# Patient Record
Sex: Male | Born: 1980
Health system: Southern US, Community
[De-identification: ages and names within clinical notes are randomized; demographics above are authoritative.]

## PROBLEM LIST (undated history)

## (undated) DIAGNOSIS — R0789 Other chest pain: Secondary | ICD-10-CM

## (undated) DIAGNOSIS — R079 Chest pain, unspecified: Secondary | ICD-10-CM

## (undated) DIAGNOSIS — E039 Hypothyroidism, unspecified: Secondary | ICD-10-CM

## (undated) DIAGNOSIS — J309 Allergic rhinitis, unspecified: Secondary | ICD-10-CM

## (undated) DIAGNOSIS — E079 Disorder of thyroid, unspecified: Secondary | ICD-10-CM

## (undated) DIAGNOSIS — E663 Overweight: Secondary | ICD-10-CM

## (undated) DIAGNOSIS — E785 Hyperlipidemia, unspecified: Secondary | ICD-10-CM

## (undated) DIAGNOSIS — K219 Gastro-esophageal reflux disease without esophagitis: Secondary | ICD-10-CM

## (undated) DIAGNOSIS — Z6828 Body mass index (BMI) 28.0-28.9, adult: Secondary | ICD-10-CM

## (undated) DIAGNOSIS — E559 Vitamin D deficiency, unspecified: Secondary | ICD-10-CM

## (undated) HISTORY — DX: Chest pain, unspecified: R07.9

## (undated) HISTORY — DX: Overweight: E66.3

## (undated) HISTORY — DX: Vitamin D deficiency, unspecified: E55.9

## (undated) HISTORY — DX: Body mass index (BMI) 28.0-28.9, adult: Z68.28

## (undated) HISTORY — DX: Hyperlipidemia, unspecified: E78.5

## (undated) HISTORY — DX: Allergic rhinitis, unspecified: J30.9

## (undated) HISTORY — DX: Other chest pain: R07.89

## (undated) HISTORY — DX: Gastro-esophageal reflux disease without esophagitis: K21.9

## (undated) HISTORY — DX: Hypothyroidism, unspecified: E03.9

---
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
## 2017-03-25 DIAGNOSIS — E559 Vitamin D deficiency, unspecified: Secondary | ICD-10-CM | POA: Diagnosis not present

## 2017-03-25 DIAGNOSIS — Z1331 Encounter for screening for depression: Secondary | ICD-10-CM | POA: Diagnosis not present

## 2017-03-25 DIAGNOSIS — E663 Overweight: Secondary | ICD-10-CM | POA: Diagnosis not present

## 2017-03-25 DIAGNOSIS — Z0000 Encounter for general adult medical examination without abnormal findings: Secondary | ICD-10-CM | POA: Diagnosis not present

## 2017-04-30 DIAGNOSIS — Z713 Dietary counseling and surveillance: Secondary | ICD-10-CM | POA: Diagnosis not present

## 2017-05-09 DIAGNOSIS — H9311 Tinnitus, right ear: Secondary | ICD-10-CM | POA: Diagnosis not present

## 2018-02-04 DIAGNOSIS — M542 Cervicalgia: Secondary | ICD-10-CM

## 2018-02-04 DIAGNOSIS — H9311 Tinnitus, right ear: Secondary | ICD-10-CM

## 2018-02-04 HISTORY — DX: Cervicalgia: M54.2

## 2018-02-04 HISTORY — DX: Tinnitus, right ear: H93.11

## 2018-06-16 DIAGNOSIS — Z0000 Encounter for general adult medical examination without abnormal findings: Secondary | ICD-10-CM | POA: Diagnosis not present

## 2018-06-16 DIAGNOSIS — Z1331 Encounter for screening for depression: Secondary | ICD-10-CM | POA: Diagnosis not present

## 2018-12-17 DIAGNOSIS — E559 Vitamin D deficiency, unspecified: Secondary | ICD-10-CM | POA: Diagnosis not present

## 2018-12-17 DIAGNOSIS — E039 Hypothyroidism, unspecified: Secondary | ICD-10-CM | POA: Diagnosis not present

## 2018-12-17 DIAGNOSIS — J309 Allergic rhinitis, unspecified: Secondary | ICD-10-CM | POA: Diagnosis not present

## 2018-12-17 DIAGNOSIS — E785 Hyperlipidemia, unspecified: Secondary | ICD-10-CM | POA: Diagnosis not present

## 2019-01-20 DIAGNOSIS — J328 Other chronic sinusitis: Secondary | ICD-10-CM | POA: Diagnosis not present

## 2019-01-20 DIAGNOSIS — J301 Allergic rhinitis due to pollen: Secondary | ICD-10-CM | POA: Diagnosis not present

## 2019-01-20 DIAGNOSIS — J342 Deviated nasal septum: Secondary | ICD-10-CM | POA: Diagnosis not present

## 2019-01-20 DIAGNOSIS — J3489 Other specified disorders of nose and nasal sinuses: Secondary | ICD-10-CM | POA: Diagnosis not present

## 2019-01-20 DIAGNOSIS — H9011 Conductive hearing loss, unilateral, right ear, with unrestricted hearing on the contralateral side: Secondary | ICD-10-CM | POA: Diagnosis not present

## 2019-01-28 DIAGNOSIS — H8010 Otosclerosis involving oval window, obliterative, unspecified ear: Secondary | ICD-10-CM | POA: Diagnosis not present

## 2019-01-28 DIAGNOSIS — H9201 Otalgia, right ear: Secondary | ICD-10-CM | POA: Diagnosis not present

## 2019-01-28 DIAGNOSIS — H9191 Unspecified hearing loss, right ear: Secondary | ICD-10-CM | POA: Diagnosis not present

## 2019-02-05 DIAGNOSIS — H8010 Otosclerosis involving oval window, obliterative, unspecified ear: Secondary | ICD-10-CM | POA: Diagnosis not present

## 2019-02-05 DIAGNOSIS — H90A11 Conductive hearing loss, unilateral, right ear with restricted hearing on the contralateral side: Secondary | ICD-10-CM | POA: Diagnosis not present

## 2019-02-05 DIAGNOSIS — H9011 Conductive hearing loss, unilateral, right ear, with unrestricted hearing on the contralateral side: Secondary | ICD-10-CM | POA: Diagnosis not present

## 2019-04-13 DIAGNOSIS — H8091 Unspecified otosclerosis, right ear: Secondary | ICD-10-CM | POA: Diagnosis not present

## 2019-04-13 DIAGNOSIS — J340 Abscess, furuncle and carbuncle of nose: Secondary | ICD-10-CM | POA: Diagnosis not present

## 2019-04-13 DIAGNOSIS — J342 Deviated nasal septum: Secondary | ICD-10-CM | POA: Diagnosis not present

## 2019-04-13 DIAGNOSIS — H9011 Conductive hearing loss, unilateral, right ear, with unrestricted hearing on the contralateral side: Secondary | ICD-10-CM | POA: Diagnosis not present

## 2019-05-18 DIAGNOSIS — Z6827 Body mass index (BMI) 27.0-27.9, adult: Secondary | ICD-10-CM | POA: Diagnosis not present

## 2019-05-18 DIAGNOSIS — H8101 Meniere's disease, right ear: Secondary | ICD-10-CM | POA: Diagnosis not present

## 2019-05-18 DIAGNOSIS — Z01118 Encounter for examination of ears and hearing with other abnormal findings: Secondary | ICD-10-CM | POA: Diagnosis not present

## 2019-05-18 DIAGNOSIS — H8090 Unspecified otosclerosis, unspecified ear: Secondary | ICD-10-CM | POA: Diagnosis not present

## 2019-05-18 DIAGNOSIS — H9011 Conductive hearing loss, unilateral, right ear, with unrestricted hearing on the contralateral side: Secondary | ICD-10-CM | POA: Diagnosis not present

## 2019-05-26 DIAGNOSIS — H8101 Meniere's disease, right ear: Secondary | ICD-10-CM | POA: Diagnosis not present

## 2019-05-27 DIAGNOSIS — R42 Dizziness and giddiness: Secondary | ICD-10-CM | POA: Diagnosis not present

## 2019-05-27 DIAGNOSIS — H9311 Tinnitus, right ear: Secondary | ICD-10-CM | POA: Diagnosis not present

## 2019-05-27 DIAGNOSIS — H9191 Unspecified hearing loss, right ear: Secondary | ICD-10-CM | POA: Diagnosis not present

## 2019-05-28 DIAGNOSIS — J029 Acute pharyngitis, unspecified: Secondary | ICD-10-CM | POA: Diagnosis not present

## 2019-06-12 DIAGNOSIS — H8101 Meniere's disease, right ear: Secondary | ICD-10-CM | POA: Diagnosis not present

## 2019-06-18 DIAGNOSIS — Z1331 Encounter for screening for depression: Secondary | ICD-10-CM | POA: Diagnosis not present

## 2019-06-18 DIAGNOSIS — K219 Gastro-esophageal reflux disease without esophagitis: Secondary | ICD-10-CM | POA: Diagnosis not present

## 2019-06-18 DIAGNOSIS — J029 Acute pharyngitis, unspecified: Secondary | ICD-10-CM | POA: Diagnosis not present

## 2019-06-18 DIAGNOSIS — Z79899 Other long term (current) drug therapy: Secondary | ICD-10-CM | POA: Diagnosis not present

## 2019-06-18 DIAGNOSIS — J020 Streptococcal pharyngitis: Secondary | ICD-10-CM | POA: Diagnosis not present

## 2019-06-18 DIAGNOSIS — R1310 Dysphagia, unspecified: Secondary | ICD-10-CM | POA: Diagnosis not present

## 2019-06-30 DIAGNOSIS — J029 Acute pharyngitis, unspecified: Secondary | ICD-10-CM | POA: Diagnosis not present

## 2019-07-21 DIAGNOSIS — H9041 Sensorineural hearing loss, unilateral, right ear, with unrestricted hearing on the contralateral side: Secondary | ICD-10-CM | POA: Diagnosis not present

## 2019-07-21 DIAGNOSIS — R070 Pain in throat: Secondary | ICD-10-CM | POA: Diagnosis not present

## 2019-07-21 DIAGNOSIS — J309 Allergic rhinitis, unspecified: Secondary | ICD-10-CM | POA: Diagnosis not present

## 2019-11-20 DIAGNOSIS — R05 Cough: Secondary | ICD-10-CM | POA: Diagnosis not present

## 2019-11-20 DIAGNOSIS — J189 Pneumonia, unspecified organism: Secondary | ICD-10-CM | POA: Diagnosis not present

## 2019-11-20 DIAGNOSIS — U071 COVID-19: Secondary | ICD-10-CM | POA: Diagnosis not present

## 2019-11-23 ENCOUNTER — Emergency Department (HOSPITAL_COMMUNITY)
Admission: EM | Admit: 2019-11-23 | Discharge: 2019-11-23 | Disposition: A | Discharge status: Home / Self Care | Payer: BC Managed Care – PPO | Attending: Emergency Medicine | Admitting: Emergency Medicine

## 2019-11-23 ENCOUNTER — Emergency Department (HOSPITAL_COMMUNITY): Payer: BC Managed Care – PPO

## 2019-11-23 ENCOUNTER — Other Ambulatory Visit: Payer: Self-pay

## 2019-11-23 ENCOUNTER — Encounter (HOSPITAL_COMMUNITY): Payer: Self-pay | Admitting: Emergency Medicine

## 2019-11-23 DIAGNOSIS — J988 Other specified respiratory disorders: Secondary | ICD-10-CM | POA: Diagnosis not present

## 2019-11-23 DIAGNOSIS — Z79899 Other long term (current) drug therapy: Secondary | ICD-10-CM | POA: Insufficient documentation

## 2019-11-23 DIAGNOSIS — U071 COVID-19: Secondary | ICD-10-CM | POA: Diagnosis not present

## 2019-11-23 DIAGNOSIS — R0602 Shortness of breath: Secondary | ICD-10-CM | POA: Insufficient documentation

## 2019-11-23 DIAGNOSIS — R05 Cough: Secondary | ICD-10-CM | POA: Diagnosis not present

## 2019-11-23 DIAGNOSIS — J90 Pleural effusion, not elsewhere classified: Secondary | ICD-10-CM | POA: Diagnosis not present

## 2019-11-23 DIAGNOSIS — Z87891 Personal history of nicotine dependence: Secondary | ICD-10-CM | POA: Diagnosis not present

## 2019-11-23 HISTORY — DX: Disorder of thyroid, unspecified: E07.9

## 2019-11-23 LAB — CBC WITH DIFFERENTIAL/PLATELET
Abs Immature Granulocytes: 0.07 10*3/uL (ref 0.00–0.07)
Basophils Absolute: 0 10*3/uL (ref 0.0–0.1)
Basophils Relative: 0 %
Eosinophils Absolute: 0 10*3/uL (ref 0.0–0.5)
Eosinophils Relative: 0 %
HCT: 46.6 % (ref 39.0–52.0)
Hemoglobin: 14.9 g/dL (ref 13.0–17.0)
Immature Granulocytes: 1 %
Lymphocytes Relative: 9 %
Lymphs Abs: 0.8 10*3/uL (ref 0.7–4.0)
MCH: 29.7 pg (ref 26.0–34.0)
MCHC: 32 g/dL (ref 30.0–36.0)
MCV: 92.8 fL (ref 80.0–100.0)
Monocytes Absolute: 0.9 10*3/uL (ref 0.1–1.0)
Monocytes Relative: 9 %
Neutro Abs: 7.7 10*3/uL (ref 1.7–7.7)
Neutrophils Relative %: 81 %
Platelets: 272 10*3/uL (ref 150–400)
RBC: 5.02 MIL/uL (ref 4.22–5.81)
RDW: 13.2 % (ref 11.5–15.5)
WBC: 9.5 10*3/uL (ref 4.0–10.5)
nRBC: 0 % (ref 0.0–0.2)

## 2019-11-23 LAB — COMPREHENSIVE METABOLIC PANEL
ALT: 33 U/L (ref 0–44)
AST: 31 U/L (ref 15–41)
Albumin: 3.5 g/dL (ref 3.5–5.0)
Alkaline Phosphatase: 43 U/L (ref 38–126)
Anion gap: 12 (ref 5–15)
BUN: 12 mg/dL (ref 6–20)
CO2: 23 mmol/L (ref 22–32)
Calcium: 9 mg/dL (ref 8.9–10.3)
Chloride: 106 mmol/L (ref 98–111)
Creatinine, Ser: 1.09 mg/dL (ref 0.61–1.24)
GFR calc Af Amer: >60 mL/min (ref >60)
GFR calc non Af Amer: >60 mL/min (ref >60)
Glucose, Bld: 106 mg/dL — ABNORMAL HIGH (ref 70–99)
Potassium: 4 mmol/L (ref 3.5–5.1)
Sodium: 141 mmol/L (ref 135–145)
Total Bilirubin: 0.4 mg/dL (ref 0.3–1.2)
Total Protein: 6.9 g/dL (ref 6.5–8.1)

## 2019-11-23 LAB — TRIGLYCERIDES: Triglycerides: 140 mg/dL (ref <150)

## 2019-11-23 LAB — FERRITIN: Ferritin: 274 ng/mL (ref 24–336)

## 2019-11-23 LAB — D-DIMER, QUANTITATIVE: D-Dimer, Quant: 0.31 ug/mL-FEU (ref 0.00–0.50)

## 2019-11-23 LAB — LACTATE DEHYDROGENASE: LDH: 221 U/L — ABNORMAL HIGH (ref 98–192)

## 2019-11-23 LAB — FIBRINOGEN: Fibrinogen: 515 mg/dL — ABNORMAL HIGH (ref 210–475)

## 2019-11-23 LAB — SARS CORONAVIRUS 2 BY RT PCR (HOSPITAL ORDER, PERFORMED IN ~~LOC~~ HOSPITAL LAB): SARS Coronavirus 2: POSITIVE — AB

## 2019-11-23 LAB — BRAIN NATRIURETIC PEPTIDE: B Natriuretic Peptide: 90.3 pg/mL (ref 0.0–100.0)

## 2019-11-23 LAB — PROCALCITONIN: Procalcitonin: <0.1 ng/mL

## 2019-11-23 LAB — C-REACTIVE PROTEIN: CRP: 0.6 mg/dL (ref <1.0)

## 2019-11-23 LAB — LACTIC ACID, PLASMA: Lactic Acid, Venous: 1.3 mmol/L (ref 0.5–1.9)

## 2019-11-23 MED ORDER — ALBUTEROL SULFATE HFA 108 (90 BASE) MCG/ACT IN AERS
2.0000 | INHALATION_SPRAY | Freq: Once | RESPIRATORY_TRACT | Status: AC
  Administered 2019-11-23: 2 via RESPIRATORY_TRACT
  Filled 2019-11-23: qty 6.7

## 2019-11-23 NOTE — ED Notes (Signed)
Dc instructions reviewed with pt. Pt verbalized understanding. Pt DC. 

## 2019-11-23 NOTE — ED Triage Notes (Signed)
Pt arrives from home complain of increased sob and cough.  Pt diagnosed covid +. NAD. A&O x4. VSS.

## 2019-11-23 NOTE — ED Notes (Signed)
1st Lactic Acid normal. 2nd to be discontinued. 

## 2019-11-23 NOTE — ED Provider Notes (Signed)
[facility] EMERGENCY DEPARTMENT Provider Note   CSN: [id number] Arrival date & time: 11/23/19  2542     History Chief Complaint  Patient presents with  . Cough  . Shortness of Breath    Gary Spencer is a 39 y.o. male.  HPI   39 year old male with cough and shortness of breath.  Recently diagnosed with COVID.  Developed symptoms on 11/13/2019.  Symptoms worsen last 2 days.  He has been checking his oxygen level at home and it has dropped down as low as 85% with ambulation.  Improves with rest.  He feels relatively okay at rest. Nonproductive cough.  Denies any significant GI complaints.  History of hypothyroidism, otherwise healthy.  Non-smoker.  No unusual leg pain or swelling.   Past Medical History:  Diagnosis Date  . Thyroid disease     There are no problems to display for this patient.   History reviewed. No pertinent surgical history.     History reviewed. No pertinent family history.  Social History   Tobacco Use  . Smoking status: Never Smoker  . Smokeless tobacco: Former Neurosurgeon    Types: Snuff  Substance Use Topics  . Alcohol use: Yes    Comment: socially  . Drug use: Never    Home Medications Prior to Admission medications   Medication Sig Start Date End Date Taking? Authorizing Provider  aspirin EC 81 MG tablet Take 81 mg by mouth daily.   Yes [provider]  cetirizine (ZYRTEC) 10 MG tablet Take 10 mg by mouth daily as needed for allergies or rhinitis.  07/21/19  Yes [provider]  dexamethasone (DECADRON) 4 MG tablet Take 6 mg by mouth daily.  11/20/19  Yes [provider]  Dextromethorphan-guaiFENesin (MUCINEX DM PO) Take 1 tablet by mouth 2 (two) times daily as needed (chest congestion, cough).   Yes [provider]  doxycycline (VIBRAMYCIN) 100 MG capsule Take 100 mg by mouth 2 (two) times daily. 11/20/19  Yes [provider]  ELDERBERRY PO Take 1 each by mouth daily.   Yes [provider]  fluticasone (FLONASE) 50 MCG/ACT nasal spray Place 2 sprays into both nostrils daily as needed for allergies or rhinitis.  07/21/19  Yes [provider]  guaiFENesin-codeine 100-10 MG/5ML syrup Take 10 mLs by mouth every 4 (four) hours as needed for cough.  11/20/19  Yes [provider]  Homeopathic Products (ZICAM COLD REMEDY PO) Take 1 tablet by mouth every 4 (four) hours as needed (cold symptoms).   Yes [provider]  ibuprofen (ADVIL) 200 MG tablet Take 1 tablet by mouth every 6 (six) hours as needed for fever, mild pain or moderate pain.    Yes [provider]  levothyroxine (SYNTHROID) 125 MCG tablet Take 125 mcg by mouth daily. 08/09/19  Yes [provider]  Multiple Vitamins-Minerals (IMMUNE SYSTEM BOOSTER PO) Take 3 each by mouth daily.   Yes [provider]  triamterene-hydrochlorothiazide (DYAZIDE) 37.5-25 MG capsule Take 1 capsule by mouth daily. 10/23/19  Yes [provider]  Vitamin D, Ergocalciferol, (DRISDOL) 1.25 MG (50000 UNIT) CAPS capsule Take 50,000 Units by mouth once a week. 08/20/19  Yes [provider]  amoxicillin (AMOXIL) 500 MG capsule Take 500 mg by mouth 3 (three) times daily. Patient not taking: Reported on 11/23/2019 06/18/19   [provider]  nystatin (MYCOSTATIN) 100000 UNIT/ML suspension Take 5 mLs by mouth 4 (four) times daily. Patient not taking: Reported on 11/23/2019 05/28/19  [provider]  omeprazole (PRILOSEC) 40 MG capsule Take 40 mg by mouth daily. Patient not taking: Reported on 11/23/2019 06/18/19   [provider]    Allergies    Patient has no known allergies.  Review of Systems   Review of Systems All systems reviewed and negative, other than as noted in HPI.  Physical Exam Updated Vital Signs BP 132/79   Pulse 76   Temp 98.4 F (36.9 C) (Oral)   Resp 18   Ht 6\' 2"  (1.88 m)   Wt 95.3 kg   SpO2 97%   BMI 26.96 kg/m   Physical  Exam Vitals and nursing note reviewed.  Constitutional:      General: He is not in acute distress.    Appearance: He is well-developed.  HENT:     Head: Normocephalic and atraumatic.  Eyes:     General:        Right eye: No discharge.        Left eye: No discharge.     Conjunctiva/sclera: Conjunctivae normal.  Cardiovascular:     Rate and Rhythm: Normal rate and regular rhythm.     Heart sounds: Normal heart sounds. No murmur heard.  No friction rub. No gallop.   Pulmonary:     Effort: Pulmonary effort is normal. No respiratory distress.     Breath sounds: Normal breath sounds.  Abdominal:     General: There is no distension.     Palpations: Abdomen is soft.     Tenderness: There is no abdominal tenderness.  Musculoskeletal:        General: No tenderness.     Cervical back: Neck supple.     Comments: Lower extremities symmetric as compared to each other. No calf tenderness. Negative Homan's. No palpable cords.   Skin:    General: Skin is warm and dry.  Neurological:     Mental Status: He is alert.  Psychiatric:        Behavior: Behavior normal.        Thought Content: Thought content normal.     ED Results / Procedures / Treatments   Labs (all labs ordered are listed, but only abnormal results are displayed) Labs Reviewed  SARS CORONAVIRUS 2 BY RT PCR (HOSPITAL ORDER, PERFORMED IN [place] [facility]) - Abnormal; Notable for the following components:      Result Value   SARS Coronavirus 2 POSITIVE (*)    All other components within normal limits  COMPREHENSIVE METABOLIC PANEL - Abnormal; Notable for the following components:   Glucose, Bld 106 (*)    All other components within normal limits  LACTATE DEHYDROGENASE - Abnormal; Notable for the following components:   LDH 221 (*)    All other components within normal limits  FIBRINOGEN - Abnormal; Notable for the following components:   Fibrinogen 515 (*)    All other components within normal limits  CULTURE,  BLOOD (ROUTINE X 2)  CULTURE, BLOOD (ROUTINE X 2)  LACTIC ACID, PLASMA  CBC WITH DIFFERENTIAL/PLATELET  D-DIMER, QUANTITATIVE (NOT AT [facility])  PROCALCITONIN  FERRITIN  TRIGLYCERIDES  C-REACTIVE PROTEIN  BRAIN NATRIURETIC PEPTIDE    EKG EKG Interpretation  Date/Time:  Monday November 23 2019 09:11:22 EDT Ventricular Rate:  70 PR Interval:    QRS Duration: 92 QT Interval:  391 QTC Calculation: 422 R Axis:   52 Text Interpretation: Sinus rhythm Abnormal R-wave progression, early transition Confirmed by [id number] [phone]) on 11/23/2019 9:44:09 AM   Radiology DG Chest  Port 1 View  Result Date: 11/23/2019 CLINICAL DATA:  Cough and shortness of breath COVID positive EXAM: PORTABLE CHEST 1 VIEW COMPARISON:  Chest x-ray 10/23/2016 FINDINGS: The cardiac silhouette, mediastinal and hilar contours are within normal limits. Low lung volumes with mild vascular crowding. Patchy ill-defined interstitial infiltrates suggesting atypical pneumonia such as COVID pneumonia. No airspace consolidation or pleural effusions. IMPRESSION: Patchy ill-defined interstitial infiltrates suggesting atypical pneumonia such as COVID pneumonia. Electronically Signed   By: Rudie Meyer M.D.   On: 11/23/2019 10:02    Procedures Procedures (including critical care time)  Medications Ordered in ED Medications  albuterol (VENTOLIN HFA) 108 (90 Base) MCG/ACT inhaler 2 puff (has no administration in time range)    ED Course  I have reviewed the triage vital signs and the nursing notes.  Pertinent labs & imaging results that were available during my care of the patient were reviewed by me and considered in my medical decision making (see chart for details).    MDM Rules/Calculators/A&P                          39yM with cough and dyspnea. Reported oxygen sats into 80s with exertion at home. Have been normal on room air while in the ED. CXR as expected. Was previously prescribed decadron and doxycycline. Provided  with albuterol inhaler. Unlikely PE with d-dimer of 0.31.  Continue symptomatic treatment.  Return precautions discussed.   Final Clinical Impression(s) / ED Diagnoses Final diagnoses:  Respiratory tract infection due to COVID-19 virus    Rx / DC Orders ED Discharge Orders    None       Raeford Razor, MD 11/23/19 1356

## 2019-11-28 LAB — CULTURE, BLOOD (ROUTINE X 2)
Culture: NO GROWTH
Culture: NO GROWTH
Special Requests: ADEQUATE
Special Requests: ADEQUATE

## 2019-12-29 DIAGNOSIS — L739 Follicular disorder, unspecified: Secondary | ICD-10-CM | POA: Diagnosis not present

## 2019-12-29 DIAGNOSIS — D225 Melanocytic nevi of trunk: Secondary | ICD-10-CM | POA: Diagnosis not present

## 2020-01-13 DIAGNOSIS — Z8616 Personal history of COVID-19: Secondary | ICD-10-CM | POA: Diagnosis not present

## 2020-01-13 DIAGNOSIS — E039 Hypothyroidism, unspecified: Secondary | ICD-10-CM | POA: Diagnosis not present

## 2020-01-13 DIAGNOSIS — Z0000 Encounter for general adult medical examination without abnormal findings: Secondary | ICD-10-CM | POA: Diagnosis not present

## 2020-01-13 DIAGNOSIS — E785 Hyperlipidemia, unspecified: Secondary | ICD-10-CM | POA: Diagnosis not present

## 2020-01-13 DIAGNOSIS — E559 Vitamin D deficiency, unspecified: Secondary | ICD-10-CM | POA: Diagnosis not present

## 2021-08-18 ENCOUNTER — Ambulatory Visit: Payer: BC Managed Care – PPO | Admitting: Cardiology

## 2021-09-04 IMAGING — DX DG CHEST 1V PORT
1 series · 1 of 1 positions shown · non-contrast
Comparison: Chest x-ray 10/23/2016

CLINICAL DATA: Cough and shortness of breath COVID positive

EXAM:
PORTABLE CHEST 1 VIEW

[chest ap]
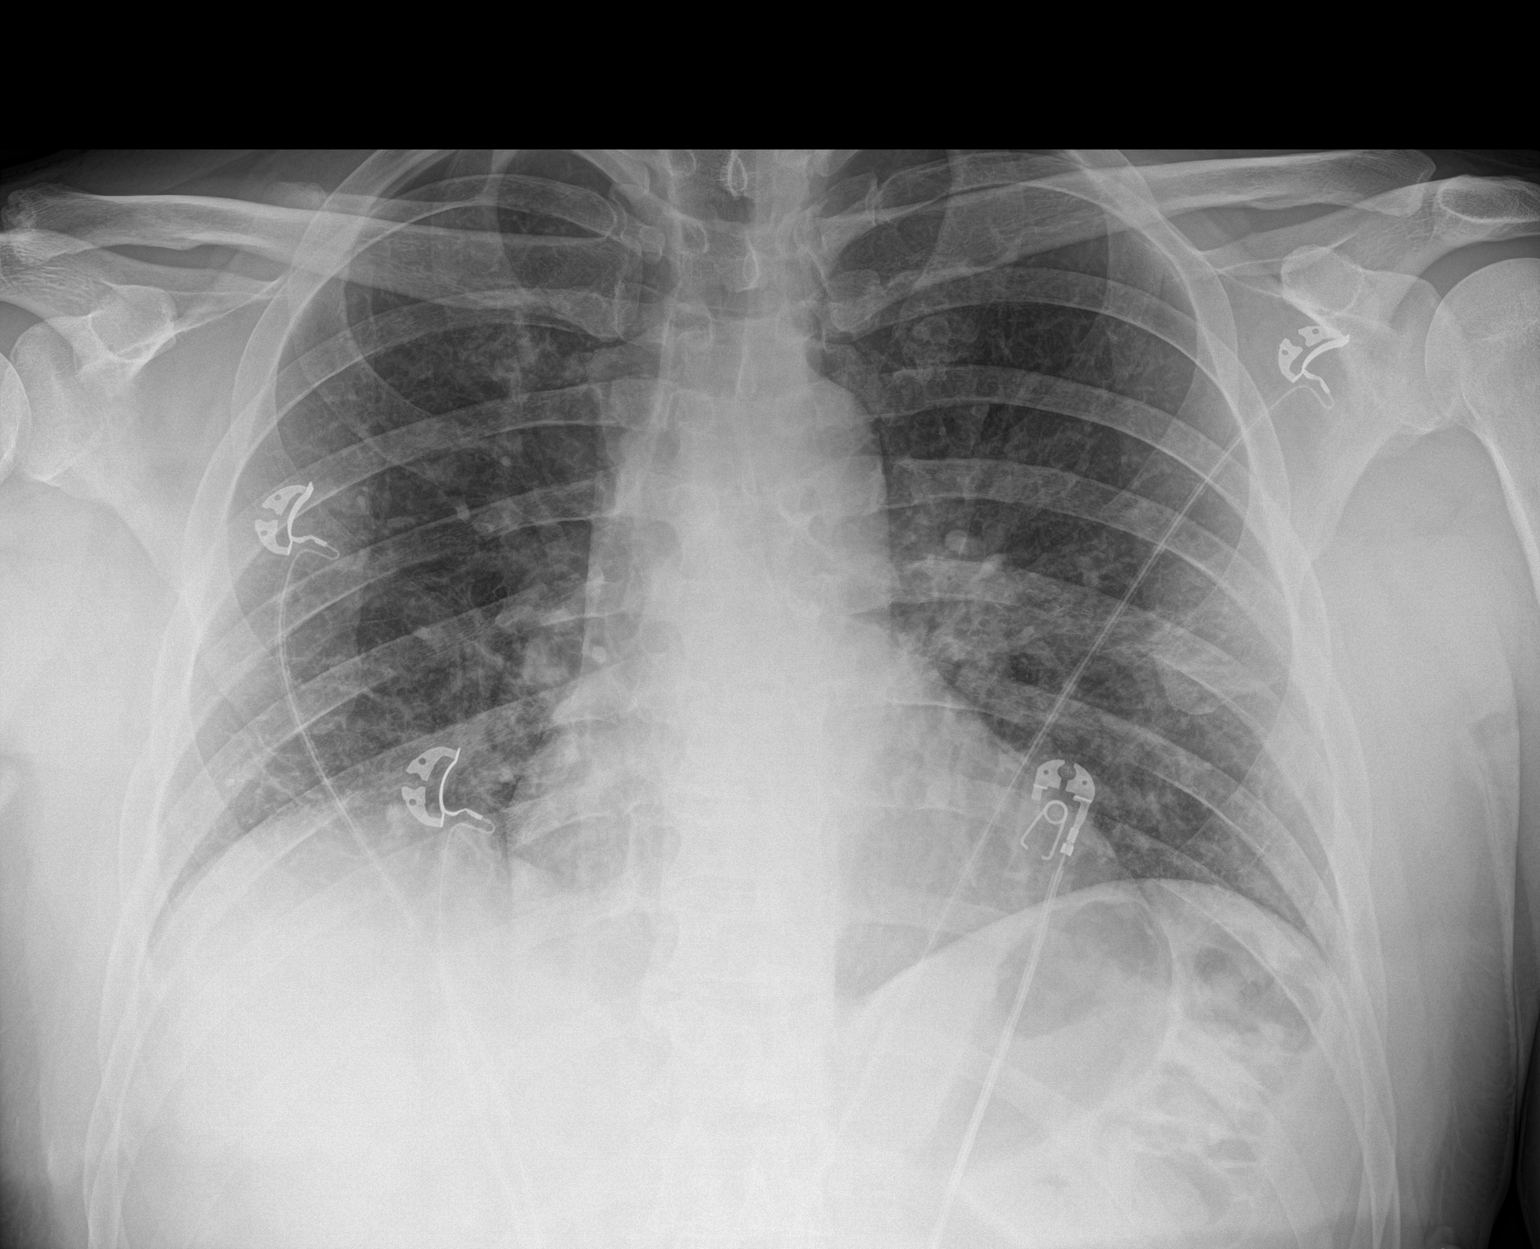

[1 of 1 positions shown; findings below may reference images not displayed]

FINDINGS: The cardiac silhouette, mediastinal and hilar contours are within
normal limits. Low lung volumes with mild vascular crowding.

Patchy ill-defined interstitial infiltrates suggesting atypical
pneumonia such as COVID pneumonia. No airspace consolidation or
pleural effusions.
IMPRESSION: Patchy ill-defined interstitial infiltrates suggesting atypical
pneumonia such as COVID pneumonia.

## 2021-09-06 ENCOUNTER — Other Ambulatory Visit: Payer: Self-pay

## 2021-09-06 DIAGNOSIS — E663 Overweight: Secondary | ICD-10-CM | POA: Insufficient documentation

## 2021-09-06 DIAGNOSIS — E785 Hyperlipidemia, unspecified: Secondary | ICD-10-CM | POA: Insufficient documentation

## 2021-09-06 DIAGNOSIS — K219 Gastro-esophageal reflux disease without esophagitis: Secondary | ICD-10-CM | POA: Insufficient documentation

## 2021-09-06 DIAGNOSIS — E039 Hypothyroidism, unspecified: Secondary | ICD-10-CM | POA: Insufficient documentation

## 2021-09-06 DIAGNOSIS — E079 Disorder of thyroid, unspecified: Secondary | ICD-10-CM | POA: Insufficient documentation

## 2021-09-06 DIAGNOSIS — E559 Vitamin D deficiency, unspecified: Secondary | ICD-10-CM | POA: Insufficient documentation

## 2021-09-06 DIAGNOSIS — R079 Chest pain, unspecified: Secondary | ICD-10-CM | POA: Insufficient documentation

## 2021-09-06 DIAGNOSIS — Z6828 Body mass index (BMI) 28.0-28.9, adult: Secondary | ICD-10-CM | POA: Insufficient documentation

## 2021-09-06 DIAGNOSIS — J309 Allergic rhinitis, unspecified: Secondary | ICD-10-CM | POA: Insufficient documentation

## 2021-09-06 DIAGNOSIS — R0789 Other chest pain: Secondary | ICD-10-CM | POA: Insufficient documentation

## 2021-09-08 ENCOUNTER — Ambulatory Visit (INDEPENDENT_AMBULATORY_CARE_PROVIDER_SITE_OTHER): Payer: BC Managed Care – PPO | Admitting: Cardiology

## 2021-09-08 ENCOUNTER — Encounter: Payer: Self-pay | Admitting: Cardiology

## 2021-09-08 VITALS — BP 108/54 | HR 54 | Ht 74.0 in | Wt 212.8 lb

## 2021-09-08 DIAGNOSIS — R072 Precordial pain: Secondary | ICD-10-CM

## 2021-09-08 MED ORDER — METOPROLOL TARTRATE 25 MG PO TABS: 25.0000 mg | ORAL_TABLET | Freq: Once | ORAL | 0 refills | Status: AC

## 2021-09-08 NOTE — Progress Notes (Signed)
Cardiology Office Note:    Date:  09/08/2021   ID:  Gary Spencer, DOB [birth year], MRN [id number]  PCP:  Gary Like, NP  Cardiologist:  Norman Herrlich, MD   Referring MD: Gary Like, NP  ASSESSMENT:    No diagnosis found. PLAN:    In order of problems listed above:  Further evaluation of chest pain we decided to do cardiac CTA.  It should be helpful both getting his coronary calcium score to guide if he would benefit from lipid-lowering treatment also to assess for coronary etiologies of chest pain also evaluate for unsuspected problems Spencer aortopathy.  He has no dye allergy and no renal insufficiency we will make a decision on follow-up after the test. Clinically his symptoms are respiratory in nature transient and resolved  Next appointment to be determined   Medication Adjustments/Labs and Tests Ordered: Current medicines are reviewed at length with the patient today.  Concerns regarding medicines are outlined above.  No orders of the defined types were placed in this encounter.  No orders of the defined types were placed in this encounter.   I had chest pain seen by my primary care physician and referred to cardiology I was told that my laboratory test troponin was normal.  History of Present Illness:    Gary Spencer is a 41 y.o. male who is being seen today for the evaluation of chest pain at the request of Gary Like, NP.  He is evaluated by his PCP in April his EKG was normal there is a notation he had a troponin done as an outpatient I do not have the result.  He is here about an episode of chest pain he experienced over the Easter weekend They have traveled to the beach for the weekend he did not feel well he had a respiratory infection cough shortness of breath and he also had chest pain that was vague and nagging off-and-on nonexertional in nature but had a pleuritic component.  He recovered and has had no recurrence. Notable as he had COVID-19 2 years  ago and was very sick for about 2 weeks he did not receive any active treatment. He is under a great deal of stress in his personal and work life does not smoke he has no history of congenital rheumatic heart disease Since Easter he has had no edema shortness of breath chest pain palpitation or syncope but he is just unsettled and concerned he has underlying heart disease He also anticipates knee surgery for meniscus tear and Baker's cyst. Past Medical History:  Diagnosis Date   Allergic rhinitis    BMI 28.0-28.9,adult    Chest discomfort    Chest pain    GERD (gastroesophageal reflux disease)    Hyperlipidemia    Hypothyroidism    Neck pain 02/04/2018   Last Assessment & Plan:  Formatting of this note might be different from the original. Concern over the right ear. Chronic history of a sound that he can hear in the right ear that comes and goes in severity.  Denies any obvious hearing loss or vertigo.  Associated with this she has some discomfort that radiates down into his neck. EXAMINATION shows normal external canals and tympanic membranes bi   Overweight    Thyroid disease    Tinnitus of right ear 02/04/2018   Vitamin D deficiency     Past Surgical History:  Procedure Laterality Date   MYRINGOTOMY WITH TUBE PLACEMENT  1983    Current  Medications: No outpatient medications have been marked as taking for the 09/08/21 encounter (Appointment) with Baldo Daub, MD.     Allergies:   Patient has no known allergies.   Social History   Socioeconomic History   Marital status: Married    Spouse name: Not on file   Number of children: Not on file   Years of education: Not on file   Highest education level: Not on file  Occupational History   Not on file  Tobacco Use   Smoking status: Former    Types: Cigarettes    Quit date: 2016    Years since quitting: 7.4   Smokeless tobacco: Former    Types: Snuff  Substance and Sexual Activity   Alcohol use: Yes    Comment:  socially   Drug use: Never   Sexual activity: Not on file  Other Topics Concern   Not on file  Social History Narrative   Not on file   Social Determinants of Health   Financial Resource Strain: Not on file  Food Insecurity: Not on file  Transportation Needs: Not on file  Physical Activity: Not on file  Stress: Not on file  Social Connections: Not on file     Family History: The patient's family history includes Hyperlipidemia in his father.  ROS:   ROS Please see the history of present illness.     All other systems reviewed and are negative.  EKGs/Labs/Other Studies Reviewed:    The following studies were reviewed today:   EKG:  EKG is  ordered today.  The ekg ordered today is personally reviewed and demonstrates early repolarization pattern sinus bradycardia otherwise normal  Recent Labs: No results found for requested labs within last 8760 hours.  Recent Lipid Panel    Component Value Date/Time   TRIG 140 11/23/2019 0941    Physical Exam:    VS:  There were no vitals taken for this visit.    Wt Readings from Last 3 Encounters:  11/23/19 210 lb (95.3 kg)     GEN: Looks very healthy well nourished, well developed in no acute distress HEENT: Normal NECK: No JVD; No carotid bruits LYMPHATICS: No lymphadenopathy CARDIAC: He has no chest wall deformity or scoliosis RRR, no murmurs, rubs, gallops RESPIRATORY:  Clear to auscultation without rales, wheezing or rhonchi  ABDOMEN: Soft, non-tender, non-distended MUSCULOSKELETAL:  No edema; No deformity  SKIN: Warm and dry NEUROLOGIC:  Alert and oriented x 3 PSYCHIATRIC:  Normal affect     Signed, Norman Herrlich, MD  09/08/2021 7:46 AM    [facility]

## 2021-09-08 NOTE — Patient Instructions (Signed)
Medication Instructions:  Your physician recommends that you continue on your current medications as directed. Please refer to the Current Medication list given to you today.  *If you need a refill on your cardiac medications before your next appointment, please call your pharmacy*   Lab Work:  Labs 1 week before CT: BMP  If you have labs (blood work) drawn today and your tests are completely normal, you will receive your results only by: MyChart Message (if you have MyChart) OR A paper copy in the mail If you have any lab test that is abnormal or we need to change your treatment, we will call you to review the results.   Testing/Procedures:   Your cardiac CT will be scheduled at one of the below locations:   [facility] [address] [place], [place] [address] [phone]  OR  [facility] [address] [place], [place] [address] [phone]  If scheduled at [facility], please arrive at the [facility] and Children's Entrance (Entrance C2) of [facility] 30 minutes prior to test start time. You can use the FREE valet parking offered at entrance C (encouraged to control the heart rate for the test)  Proceed to the [facility] Radiology Department (first floor) to check-in and test prep.  All radiology patients and guests should use entrance C2 at [facility], accessed from [facility], even though the hospital's physical address listed is [address].    If scheduled at [facility], please arrive 15 mins early for check-in and test prep.  Please follow these instructions carefully (unless otherwise directed):  On the Night Before the Test: Be sure to Drink plenty of water. Do not consume any caffeinated/decaffeinated beverages or chocolate 12 hours prior to your test. Do not take any antihistamines 12 hours prior to your test.  On the Day  of the Test: Drink plenty of water until 1 hour prior to the test. Do not eat any food 4 hours prior to the test. You may take your regular medications prior to the test.  Take metoprolol (Lopressor) two hours prior to test.       After the Test: Drink plenty of water. After receiving IV contrast, you may experience a mild flushed feeling. This is normal. On occasion, you may experience a mild rash up to 24 hours after the test. This is not dangerous. If this occurs, you can take Benadryl 25 mg and increase your fluid intake. If you experience trouble breathing, this can be serious. If it is severe call 911 IMMEDIATELY. If it is mild, please call our office.  We will call to schedule your test 2-4 weeks out understanding that some insurance companies will need an authorization prior to the service being performed.   For non-scheduling related questions, please contact the cardiac imaging nurse navigator should you have any questions/concerns: Rockwell Alexandria, Cardiac Imaging Nurse Navigator Larey Brick, Cardiac Imaging Nurse Navigator [facility] Direct Office Dial: [phone]   For scheduling needs, including cancellations and rescheduling, please call [place], [phone].    Follow-Up: At [facility], you and your health needs are our priority.  As part of our continuing mission to provide you with exceptional heart care, we have created designated Provider Care Teams.  These Care Teams include your primary Cardiologist (physician) and Advanced Practice Providers (APPs -  Physician Assistants and Nurse Practitioners) who all work together to provide  you with the care you need, when you need it.  We recommend signing up for the patient portal called "MyChart".  Sign up information is provided on this After Visit Summary.  MyChart is used to connect with patients for Virtual Visits (Telemedicine).  Patients are able to view lab/test results, encounter  notes, upcoming appointments, etc.  Non-urgent messages can be sent to your provider as well.   To learn more about what you can do with MyChart, go to [email or web address].    Your next appointment:   Follow up is to be determined  The format for your next appointment:   In Person  Provider:   Norman Herrlich, MD    Other Instructions None  Important Information About Sugar

## 2021-09-25 ENCOUNTER — Telehealth: Payer: Self-pay

## 2021-09-25 NOTE — Telephone Encounter (Signed)
Patient call was transferred from the call center to me. The patient had a question as to whether he needed to cancel his coronary CT since he had not had his lab work done a week before his CT. I informed him that he should have the lab work done today in the [facility] office. He was agreeable with this plan and had no further questions.

## 2021-09-26 LAB — BASIC METABOLIC PANEL
BUN/Creatinine Ratio: 13 (ref 9–20)
BUN: 15 mg/dL (ref 6–24)
CO2: 22 mmol/L (ref 20–29)
Calcium: 10 mg/dL (ref 8.7–10.2)
Chloride: 102 mmol/L (ref 96–106)
Creatinine, Ser: 1.12 mg/dL (ref 0.76–1.27)
Glucose: 82 mg/dL (ref 70–99)
Potassium: 5 mmol/L (ref 3.5–5.2)
Sodium: 140 mmol/L (ref 134–144)
eGFR: 84 mL/min/{1.73_m2} (ref >59)

## 2021-09-27 ENCOUNTER — Telehealth (HOSPITAL_COMMUNITY): Payer: Self-pay | Admitting: Emergency Medicine

## 2021-09-27 NOTE — Telephone Encounter (Signed)
Phone rang x 2 and then message said "the number you have called is no longer in service" unable to leave vm .Rockwell Alexandria RN Navigator Cardiac Imaging [facility] [phone] Office  [phone] Cell

## 2021-09-28 ENCOUNTER — Ambulatory Visit (HOSPITAL_COMMUNITY)
Admission: RE | Admit: 2021-09-28 | Discharge: 2021-09-28 | Disposition: A | Discharge status: Home / Self Care | Payer: BC Managed Care – PPO | Source: Ambulatory Visit | Attending: Cardiology | Admitting: Cardiology

## 2021-09-28 ENCOUNTER — Encounter (HOSPITAL_COMMUNITY): Payer: Self-pay

## 2021-09-28 DIAGNOSIS — R072 Precordial pain: Secondary | ICD-10-CM

## 2021-09-28 MED ORDER — NITROGLYCERIN 0.4 MG SL SUBL
SUBLINGUAL_TABLET | SUBLINGUAL | Status: AC
  Filled 2021-09-28: qty 2

## 2021-09-28 MED ORDER — NITROGLYCERIN 0.4 MG SL SUBL
0.8000 mg | SUBLINGUAL_TABLET | Freq: Once | SUBLINGUAL | Status: AC
  Administered 2021-09-28: 0.8 mg via SUBLINGUAL

## 2021-09-28 MED ORDER — IOHEXOL 350 MG/ML SOLN
100.0000 mL | Freq: Once | INTRAVENOUS | Status: AC | PRN
  Administered 2021-09-28: 100 mL via INTRAVENOUS

## 2021-09-29 ENCOUNTER — Telehealth: Payer: Self-pay | Admitting: Cardiology

## 2021-09-29 ENCOUNTER — Telehealth: Payer: Self-pay

## 2021-09-29 NOTE — Telephone Encounter (Signed)
Patient is returning phone call.  °

## 2023-07-11 IMAGING — CT CT HEART MORP W/ CTA COR W/ SCORE W/ CA W/CM &/OR W/O CM
4 of 7 series · 8 of 20 positions shown, 9 images · IV contrast (APPLIED)
Comparison: None Available.
COMPARISON: None Available.

Addendum:
EXAM:
OVER-READ INTERPRETATION  CT CHEST

The following report is a limited chest CT over-read performed by
09/28/2021. This over-read does not include interpretation of cardiac
or coronary anatomy or pathology. The coronary calcium
score/coronary CTA interpretation by the cardiologist is attached.
HISTORY: Chest pain, nonspecific
Cardiac/Coronary CT
TECHNIQUE: The patient was scanned on a Siemens Force scanner.
PROTOCOL: A 100 kV prospective scan was triggered in the descending thoracic
aorta at 111 HU's. Axial non-contrast 3 mm slices were carried out
through the heart. The data set was analyzed on a dedicated work
station and scored using the Agatston method. Gantry rotation speed
was 250 msecs and collimation was .6 mm. Heart rate was optimized
medically and sl NTG was given. The 3D data set was reconstructed in
5% intervals of the 35-75 % of the R-R cycle. Systolic and diastolic
phases were analyzed on a dedicated work station using MPR, MIP and
VRT modes. The patient received 100mL OMNIPAQUE IOHEXOL 350 MG/ML
SOLN of contrast.

[Series 6: best diast · axial · 0.39mm/px · z∈[+1216,+1261]mm · 2 of 333 slices shown, 3 images]
[im 111/333  vessel]
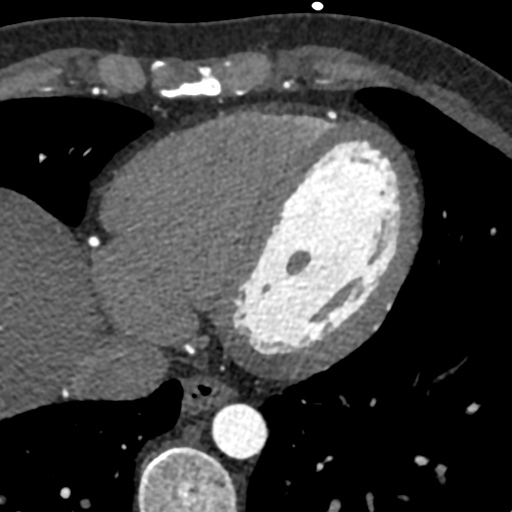
[im 111/333  lung]
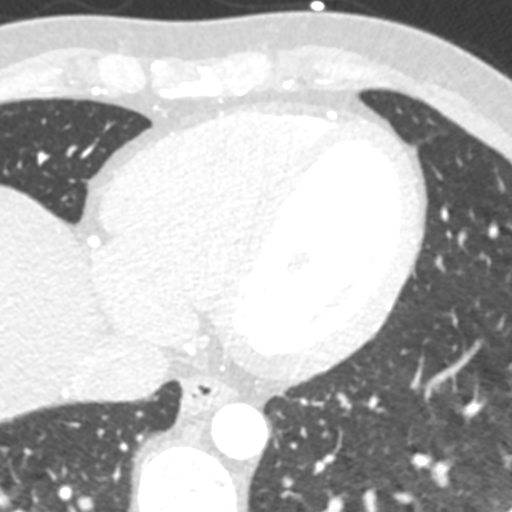
[im 222/333  vessel]
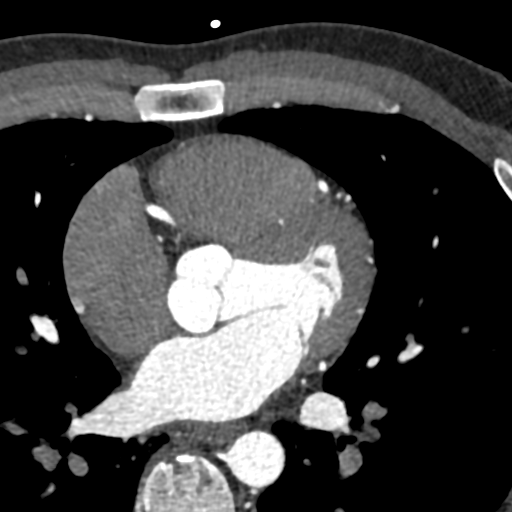

[Series 7: best syst · axial · 0.39mm/px · z∈[+1216,+1261]mm · 2 of 333 slices shown]
[im 111/333  vessel]
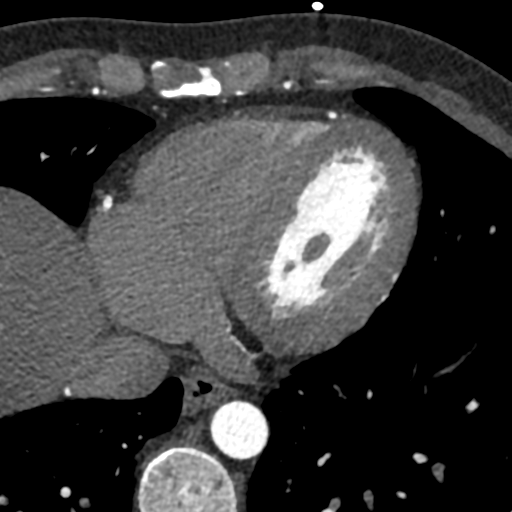
[im 222/333  vessel]
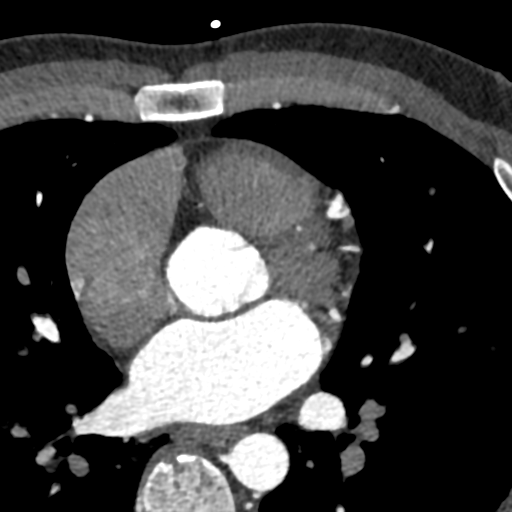

[Series 8: ts diast sharp · axial · 0.39mm/px · z∈[+1216,+1261]mm · 2 of 333 slices shown]
[im 111/333  lung]
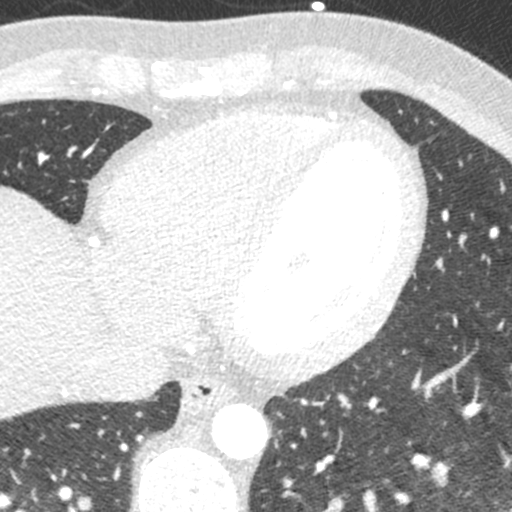
[im 222/333  lung]
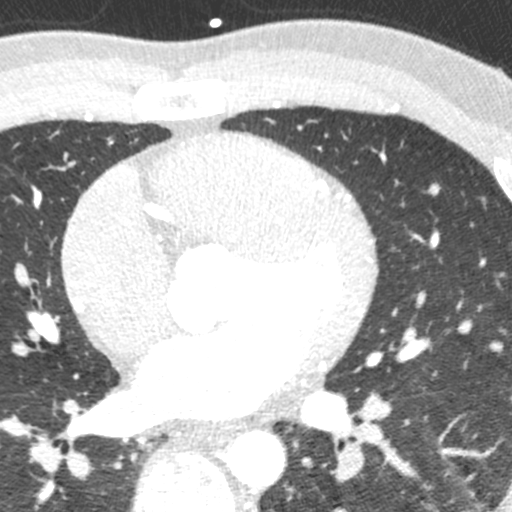

[Series 9: ts syst sharp · axial · 0.39mm/px · z∈[+1216,+1261]mm · 2 of 333 slices shown]
[im 111/333  lung]
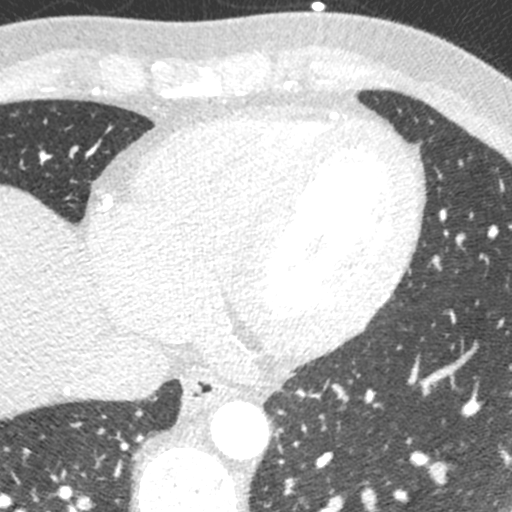
[im 222/333  lung]
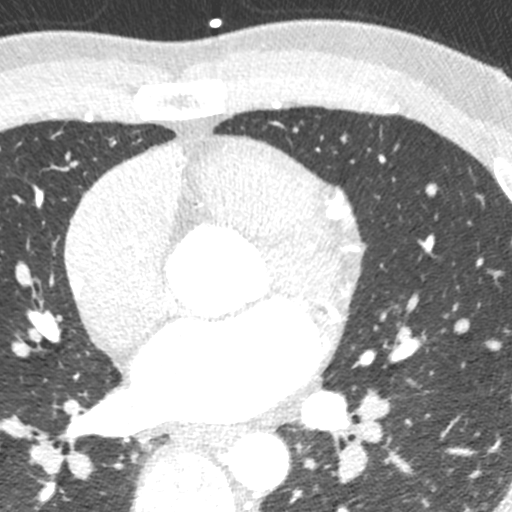

[8 of 20 positions shown; findings below may reference images not displayed]

FINDINGS: Vascular: No significant noncardiac vascular finding.

Mediastinum/Nodes: No pathologically enlarged mediastinal or hilar
lymph nodes in the acquired field-of-view. The distal esophagus is
unremarkable.

Lungs/Pleura: Within the visualized portions of the thorax there are
no suspicious pulmonary nodules or masses, no focal airspace
consolidation, and no pleural effusion or pneumothorax.

Upper Abdomen: No acute abnormality.

Musculoskeletal: No acute osseous abnormality.
IMPRESSION: No significant noncardiac findings.
FINDINGS: Coronary calcium score: The patient's coronary artery calcium score
is 0, which places the patient in the 0 percentile.

Coronary arteries: Normal coronary origins.  Right dominance.

Right Coronary Artery: Normal caliber vessel, gives rise to PDA. No
significant plaque or stenosis.

Left Main Coronary Artery: Normal caliber vessel. No significant
plaque or stenosis.

Left Anterior Descending Coronary Artery: Normal caliber vessel. No
significant plaque or stenosis. Gives rise to 2 normal caliber
diagonal branches.

Left Circumflex Artery: Normal caliber vessel. No significant plaque
or stenosis. Gives rise to small first, large second OM branches.

Aorta: Normal size, 30 mm at the mid ascending aorta (level of the
PA bifurcation) measured double oblique. No aortic atherosclerosis.
No dissection seen in visualized portions of the aorta.

Aortic Valve: No calcifications. Trileaflet.

Other findings:

Normal pulmonary vein drainage into the left atrium.

Normal left atrial appendage without a thrombus.

Normal size of the pulmonary artery.

Normal appearance of the pericardium.
IMPRESSION: 1. No evidence of CAD, CADRADS = 0. 0

2. Coronary calcium score of 0. This was 0 percentile for age and
sex matched control.

3. Normal coronary origin with right dominance.

INTERPRETATION:

1. CAD-RADS 0: No evidence of CAD (0%). Consider non-atherosclerotic
causes of chest pain.

2. CAD-RADS 1: Minimal non-obstructive CAD (0-24%). Consider
non-atherosclerotic causes of chest pain. Consider preventive
therapy and risk factor modification.

3. CAD-RADS 2: Mild non-obstructive CAD (25-49%). Consider
non-atherosclerotic causes of chest pain. Consider preventive
therapy and risk factor modification.

4. CAD-RADS 3: Moderate stenosis (50-69%). Consider symptom-guided
anti-ischemic pharmacotherapy as well as risk factor modification
per guideline directed care. Additional analysis with CT FFR will be
submitted.

5. CAD-RADS 4: Severe stenosis. (70-99% or > 50% left main). Cardiac
catheterization or CT FFR is recommended. Consider symptom-guided
anti-ischemic pharmacotherapy as well as risk factor modification
per guideline directed care. Invasive coronary angiography
recommended with revascularization per published guideline
statements.

6. CAD-RADS 5: Total coronary occlusion (100%). Consider cardiac
catheterization or viability assessment. Consider symptom-guided
anti-ischemic pharmacotherapy as well as risk factor modification
per guideline directed care.

7. CAD-RADS N: Non-diagnostic study. Obstructive CAD can't be
excluded. Alternative evaluation is recommended.

*** End of Addendum ***
EXAM:
OVER-READ INTERPRETATION  CT CHEST

The following report is a limited chest CT over-read performed by
09/28/2021. This over-read does not include interpretation of cardiac
or coronary anatomy or pathology. The coronary calcium
score/coronary CTA interpretation by the cardiologist is attached.
FINDINGS: Vascular: No significant noncardiac vascular finding.

Mediastinum/Nodes: No pathologically enlarged mediastinal or hilar
lymph nodes in the acquired field-of-view. The distal esophagus is
unremarkable.

Lungs/Pleura: Within the visualized portions of the thorax there are
no suspicious pulmonary nodules or masses, no focal airspace
consolidation, and no pleural effusion or pneumothorax.

Upper Abdomen: No acute abnormality.

Musculoskeletal: No acute osseous abnormality.
IMPRESSION: No significant noncardiac findings.
